# Patient Record
Sex: Female | Born: 1980 | Marital: Single | Smoking: Current every day smoker
Health system: Southern US, Community
[De-identification: ages and names within clinical notes are randomized; demographics above are authoritative.]

---

## 2014-08-14 ENCOUNTER — Emergency Department: Payer: Self-pay | Admitting: Emergency Medicine

## 2015-06-04 ENCOUNTER — Emergency Department
Admission: EM | Admit: 2015-06-04 | Discharge: 2015-06-04 | Disposition: A | Discharge status: Home / Self Care | Payer: Self-pay

## 2015-06-04 NOTE — ED Notes (Signed)
Patient ambulatory to triage with steady gait, without difficulty or distress noted, pt brought in by [facility] offcer for blood draw; pt voices good understanding and denies need to to see ED provider, denies any c/o

## 2015-06-04 NOTE — ED Notes (Signed)
Using kit provided by officers, tourniquet applied to right upper arm; antecubital area prepped with betadine and allowed to dry, needle inserted and blood collected; pt tolerated well; Labeled samples given to [employer] using chain of custody

## 2016-07-25 ENCOUNTER — Emergency Department
Admission: EM | Admit: 2016-07-25 | Discharge: 2016-07-25 | Disposition: A | Discharge status: Home / Self Care | Payer: Self-pay | Attending: Emergency Medicine | Admitting: Emergency Medicine

## 2016-07-25 ENCOUNTER — Emergency Department: Payer: Self-pay

## 2016-07-25 DIAGNOSIS — Y939 Activity, unspecified: Secondary | ICD-10-CM | POA: Insufficient documentation

## 2016-07-25 DIAGNOSIS — F17210 Nicotine dependence, cigarettes, uncomplicated: Secondary | ICD-10-CM | POA: Insufficient documentation

## 2016-07-25 DIAGNOSIS — F1910 Other psychoactive substance abuse, uncomplicated: Secondary | ICD-10-CM | POA: Insufficient documentation

## 2016-07-25 DIAGNOSIS — Y999 Unspecified external cause status: Secondary | ICD-10-CM | POA: Insufficient documentation

## 2016-07-25 DIAGNOSIS — Y929 Unspecified place or not applicable: Secondary | ICD-10-CM | POA: Insufficient documentation

## 2016-07-25 DIAGNOSIS — E860 Dehydration: Secondary | ICD-10-CM | POA: Insufficient documentation

## 2016-07-25 DIAGNOSIS — W19XXXA Unspecified fall, initial encounter: Secondary | ICD-10-CM | POA: Insufficient documentation

## 2016-07-25 DIAGNOSIS — S0990XA Unspecified injury of head, initial encounter: Secondary | ICD-10-CM | POA: Insufficient documentation

## 2016-07-25 DIAGNOSIS — S060X9A Concussion with loss of consciousness of unspecified duration, initial encounter: Secondary | ICD-10-CM | POA: Insufficient documentation

## 2016-07-25 DIAGNOSIS — Z5181 Encounter for therapeutic drug level monitoring: Secondary | ICD-10-CM | POA: Insufficient documentation

## 2016-07-25 LAB — URINE DRUG SCREEN, QUALITATIVE (ARMC ONLY)
AMPHETAMINES, UR SCREEN: NOT DETECTED
BENZODIAZEPINE, UR SCRN: NOT DETECTED
Barbiturates, Ur Screen: NOT DETECTED
CANNABINOID 50 NG, UR ~~LOC~~: POSITIVE — AB
Cocaine Metabolite,Ur ~~LOC~~: POSITIVE — AB
MDMA (Ecstasy)Ur Screen: NOT DETECTED
Methadone Scn, Ur: NOT DETECTED
OPIATE, UR SCREEN: NOT DETECTED
PHENCYCLIDINE (PCP) UR S: NOT DETECTED
Tricyclic, Ur Screen: NOT DETECTED

## 2016-07-25 LAB — COMPREHENSIVE METABOLIC PANEL
ALK PHOS: 65 U/L (ref 38–126)
ALT: 16 U/L (ref 14–54)
ANION GAP: 9 (ref 5–15)
AST: 21 U/L (ref 15–41)
Albumin: 4.2 g/dL (ref 3.5–5.0)
BUN: 10 mg/dL (ref 6–20)
CALCIUM: 8.8 mg/dL — AB (ref 8.9–10.3)
CHLORIDE: 99 mmol/L — AB (ref 101–111)
CO2: 29 mmol/L (ref 22–32)
CREATININE: 0.52 mg/dL (ref 0.44–1.00)
Glucose, Bld: 106 mg/dL — ABNORMAL HIGH (ref 65–99)
Potassium: 3.6 mmol/L (ref 3.5–5.1)
SODIUM: 137 mmol/L (ref 135–145)
Total Bilirubin: 0.6 mg/dL (ref 0.3–1.2)
Total Protein: 7.7 g/dL (ref 6.5–8.1)

## 2016-07-25 LAB — CBC WITH DIFFERENTIAL/PLATELET
BASOS ABS: 0 10*3/uL (ref 0–0.1)
BASOS PCT: 0 %
EOS PCT: 0 %
Eosinophils Absolute: 0 10*3/uL (ref 0–0.7)
HCT: 38.8 % (ref 35.0–47.0)
Hemoglobin: 13.2 g/dL (ref 12.0–16.0)
Lymphocytes Relative: 9 %
Lymphs Abs: 1.7 10*3/uL (ref 1.0–3.6)
MCH: 32.3 pg (ref 26.0–34.0)
MCHC: 34 g/dL (ref 32.0–36.0)
MCV: 95.1 fL (ref 80.0–100.0)
MONO ABS: 1.5 10*3/uL — AB (ref 0.2–0.9)
Monocytes Relative: 8 %
Neutro Abs: 14.6 10*3/uL — ABNORMAL HIGH (ref 1.4–6.5)
Neutrophils Relative %: 83 %
PLATELETS: 232 10*3/uL (ref 150–440)
RBC: 4.09 MIL/uL (ref 3.80–5.20)
RDW: 14.8 % — AB (ref 11.5–14.5)
WBC: 17.8 10*3/uL — AB (ref 3.6–11.0)

## 2016-07-25 LAB — URINALYSIS COMPLETE WITH MICROSCOPIC (ARMC ONLY)
BILIRUBIN URINE: NEGATIVE
Glucose, UA: NEGATIVE mg/dL
Hgb urine dipstick: NEGATIVE
LEUKOCYTES UA: NEGATIVE
NITRITE: NEGATIVE
PH: 7 (ref 5.0–8.0)
PROTEIN: NEGATIVE mg/dL
SPECIFIC GRAVITY, URINE: 1.016 (ref 1.005–1.030)

## 2016-07-25 LAB — ETHANOL

## 2016-07-25 LAB — POCT PREGNANCY, URINE: PREG TEST UR: NEGATIVE

## 2016-07-25 LAB — LIPASE, BLOOD: LIPASE: 23 U/L (ref 11–51)

## 2016-07-25 MED ORDER — KETOROLAC TROMETHAMINE 30 MG/ML IJ SOLN
15.0000 mg | INTRAMUSCULAR | Status: AC
  Administered 2016-07-25: 15 mg via INTRAVENOUS
  Filled 2016-07-25: qty 1

## 2016-07-25 MED ORDER — SODIUM CHLORIDE 0.9 % IV BOLUS (SEPSIS)
1000.0000 mL | Freq: Once | INTRAVENOUS | Status: AC
  Administered 2016-07-25: 1000 mL via INTRAVENOUS

## 2016-07-25 NOTE — ED Triage Notes (Signed)
Pt came to ED via EMS. Pt reports partying on Thursday night, drank alcohol and smoked weed and cocaine. Pt remembers falling but does not remember anything else. Has 2 hematomas to back of head, bruising to right side of ear.

## 2016-07-25 NOTE — ED Provider Notes (Signed)
[facility] Emergency Department Provider Note  ____________________________________________  Time seen: Approximately 11:13 AM  I have reviewed the triage vital signs and the nursing notes.   HISTORY  Chief Complaint Fall and Head Injury    HPI Terri Gregory is a 35 y.o. female who complains of diffuse posterior headache and neck pain over the past 24 hours. Gradual onset. She relates this all to have a substance abuse is been going on for the last 36 hours including frequent alcohol use, marijuana and cocaine use. She reports that she is a daily drinker, doesn't drink in the mornings, denies any history of withdrawal symptoms such as shaking or anxiousness or seizures.  Denies any other injuries. No belly pain and vomiting or diarrhea. No chest pain or shortness of breath.     History reviewed. No pertinent past medical history. Polysubstance abuse  There are no active problems to display for this patient.    History reviewed. No pertinent surgical history.   Prior to Admission medications   Not on File     Allergies Tetracyclines & related   No family history on file.  Social History Social History  Substance Use Topics  . Smoking status: Current Every Day Smoker    Packs/day: 1.00    Types: Cigarettes  . Smokeless tobacco: Never Used  . Alcohol use Yes    Review of Systems  Constitutional:   No fever or chills.  ENT:   Positive right ear pain. Cardiovascular:   No chest pain. Respiratory:   No dyspnea or cough. Gastrointestinal:   Negative for abdominal pain, vomiting and diarrhea.  Musculoskeletal:   Negative for focal pain or swelling Neurological:   Positive posterior headache and neck pain. No weakness or paresthesia 10-point ROS otherwise negative.  ____________________________________________   PHYSICAL EXAM:  VITAL SIGNS: ED Triage Vitals  Enc Vitals Group     BP 07/25/16 1050 (!) 141/103     Pulse Rate  07/25/16 1050 (!) 104     Resp 07/25/16 1050 16     Temp 07/25/16 1050 99.1 F (37.3 C)     Temp Source 07/25/16 1050 Oral     SpO2 07/25/16 1050 97 %     Weight 07/25/16 1051 130 lb (59 kg)     Height 07/25/16 1051 5\' 2"  (1.575 m)     Head Circumference --      Peak Flow --      Pain Score --      Pain Loc --      Pain Edu? --      Excl. in GC? --     Vital signs reviewed, nursing assessments reviewed.   Constitutional:   Alert and oriented. Well appearing and in no distress. Eyes:   No scleral icterus. No conjunctival pallor. PERRL. EOMI.  No nystagmus. ENT   Head:   Normocephalic With slight occipital scalp swelling. No lacerations or contusions. Diffusely tender over the posterior scalp. There is ecchymosis posterior to the left auricle concerning for Battle sign..   Nose:   No congestion/rhinnorhea. No septal hematoma   Mouth/Throat:   MMM, no pharyngeal erythema. No peritonsillar mass.    Neck:   No stridor. No SubQ emphysema. No meningismus. Diffuse tenderness of the neck. Hematological/Lymphatic/Immunilogical:   No cervical lymphadenopathy. Cardiovascular:   Tachycardia heart rate 105. Symmetric bilateral radial and DP pulses.  No murmurs.  Respiratory:   Normal respiratory effort without tachypnea nor retractions. Breath sounds are clear and equal bilaterally.  No wheezes/rales/rhonchi. Gastrointestinal:   Soft and nontender. Non distended. There is no CVA tenderness.  No rebound, rigidity, or guarding. Genitourinary:   deferred Musculoskeletal:   Nontender with normal range of motion in all extremities. No joint effusions.  No lower extremity tenderness.  No edema. Neurologic:   Normal speech and language.  CN 2-10 normal. Motor grossly intact. No gross focal neurologic deficits are appreciated.  Skin:    Skin is warm, dry and intact. No rash noted.  No petechiae, purpura, or bullae.  ____________________________________________    LABS (pertinent  positives/negatives) (all labs ordered are listed, but only abnormal results are displayed) Labs Reviewed  URINALYSIS COMPLETEWITH MICROSCOPIC ([facility] ONLY) - Abnormal; Notable for the following:       Result Value   Color, Urine YELLOW (*)    APPearance HAZY (*)    Ketones, ur 1+ (*)    Bacteria, UA RARE (*)    Squamous Epithelial / LPF 0-5 (*)    All other components within normal limits  URINE DRUG SCREEN, QUALITATIVE ([facility] ONLY) - Abnormal; Notable for the following:    Cocaine Metabolite,Ur [place] POSITIVE (*)    Cannabinoid 50 Ng, Ur [place] POSITIVE (*)    All other components within normal limits  COMPREHENSIVE METABOLIC PANEL - Abnormal; Notable for the following:    Chloride 99 (*)    Glucose, Bld 106 (*)    Calcium 8.8 (*)    All other components within normal limits  CBC WITH DIFFERENTIAL/PLATELET - Abnormal; Notable for the following:    WBC 17.8 (*)    RDW 14.8 (*)    Neutro Abs 14.6 (*)    Monocytes Absolute 1.5 (*)    All other components within normal limits  ETHANOL  LIPASE, BLOOD  POC URINE PREG, ED  POCT PREGNANCY, URINE   ____________________________________________   EKG  Interpreted by me Sinus tachycardia rate 101, normal axis intervals QRS ST segments and T waves.  ____________________________________________    RADIOLOGY  CT head unremarkable CT cervical spine unremarkable  ____________________________________________   PROCEDURES Procedures  ____________________________________________   INITIAL IMPRESSION / ASSESSMENT AND PLAN / ED COURSE  Pertinent labs & imaging results that were available during my care of the patient were reviewed by me and considered in my medical decision making (see chart for details).  Patient presents with headache and neck pain after recent polysubstance abuse, has poor memory and reports blacking out but multiple falls. Concern for possible skull fracture or neck injury although she has been ambulatory and has  no neurologic deficits. We will get CT head and cervical spine, check labs and pregnancy status. IV fluids for tachycardia. Does not appear to be in any significant withdrawal syndrome at this time, hemodynamically stable.   ----------------------------------------- 1:37 PM on 07/25/2016 -----------------------------------------  Workup negative. Patient moving about with good strength, ambulatory, tolerating oral intake. Clinically sober at this time. Vitals unremarkable, tachycardia improved after fluids. We'll discharge to follow up with primary care and Rha. I think the leukocytosis is likely due to her stimulant use and not due to any underlying infection. Specific there is no evidence of urinary tract infection pneumonia meningitis or encephalitis or any soft tissue infection related to her recent trauma. Does not have any wounds that warrant updating of her tetanus.    Clinical Course   ____________________________________________   FINAL CLINICAL IMPRESSION(S) / ED DIAGNOSES  Final diagnoses:  Injury of head, initial encounter  Concussion with loss of consciousness, initial encounter  Polysubstance  abuse  Dehydration       Portions of this note were generated with dragon dictation software. Dictation errors may occur despite best attempts at proofreading.    Sharman Cheek, MD 07/25/16 1340

## 2018-05-19 IMAGING — CT CT CERVICAL SPINE W/O CM
4 of 8 series · 13 of 33 positions shown, 14 images · non-contrast
Comparison: None.

CLINICAL DATA: Fall with hematoma at to back of head and
right-sided ear bruising. Initial encounter.

EXAM:
CT HEAD WITHOUT CONTRAST
CT CERVICAL SPINE WITHOUT CONTRAST
TECHNIQUE: Multidetector CT imaging of the head and cervical spine was
performed following the standard protocol without intravenous
contrast. Multiplanar CT image reconstructions of the cervical spine
were also generated.

[Series 8: c spine soft · axial · 0.28mm/px · z∈[-191,-117]mm · 3 of 75 slices shown]
[im 19/75  soft-tissue]
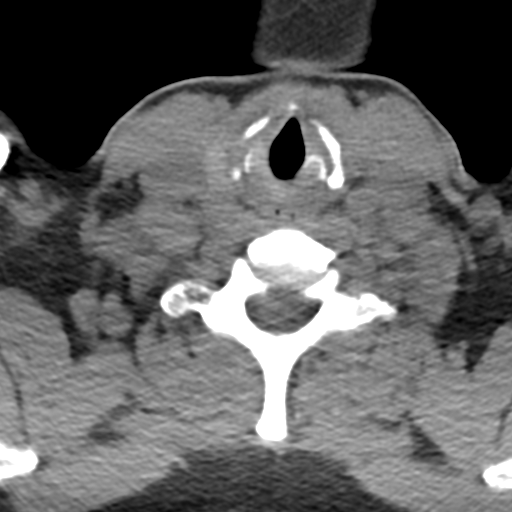
[im 38/75  soft-tissue]
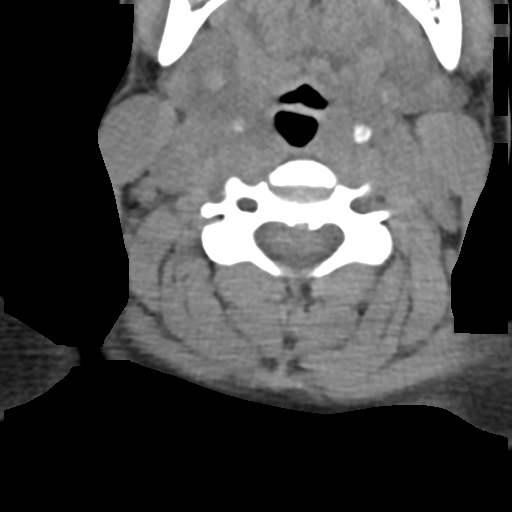
[im 56/75  soft-tissue]
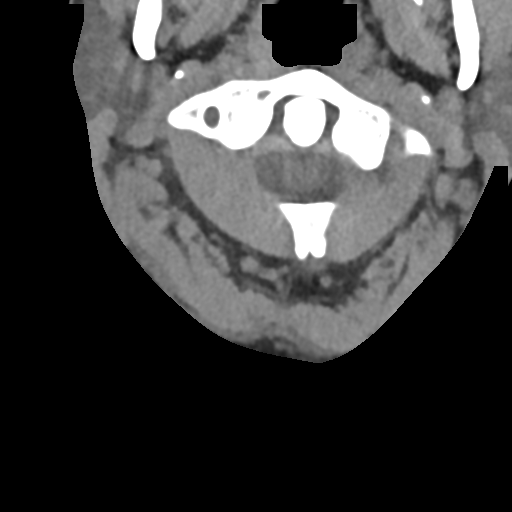

[Series 11: sagittal bone · sagittal · 0.25mm/px · 5 of 59 slices shown]
[im 10/59  bone]
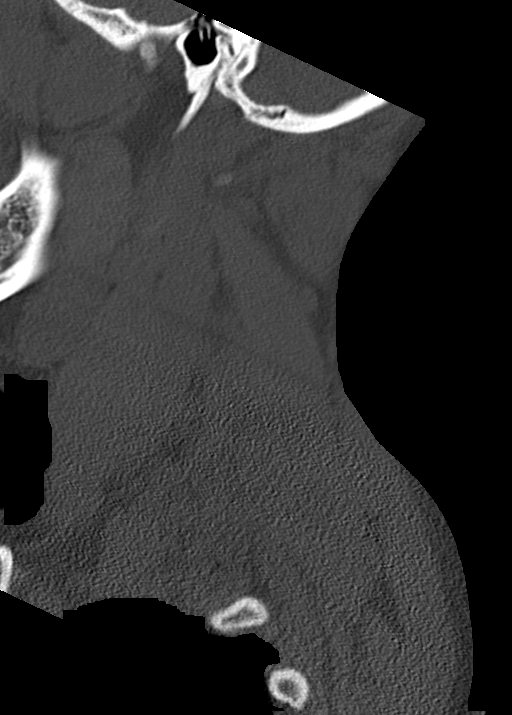
[im 20/59  bone]
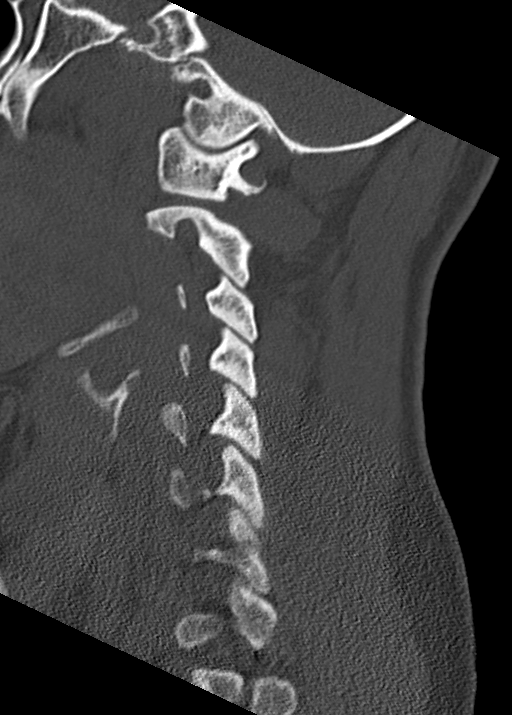
[im 30/59  bone]
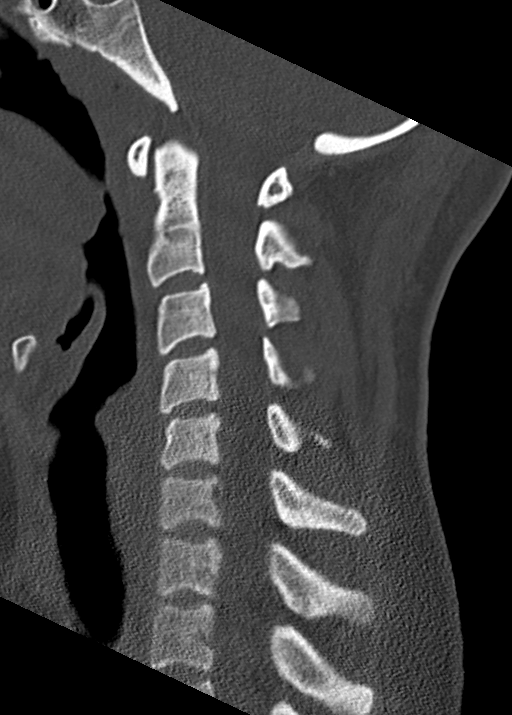
[im 39/59  bone]
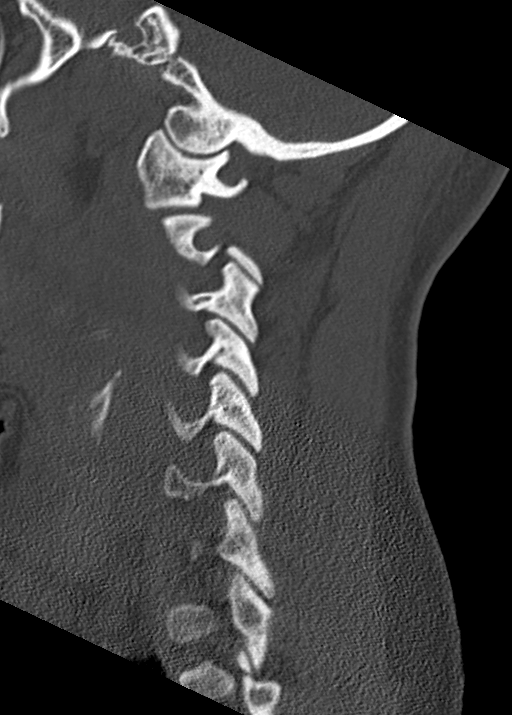
[im 49/59  bone]
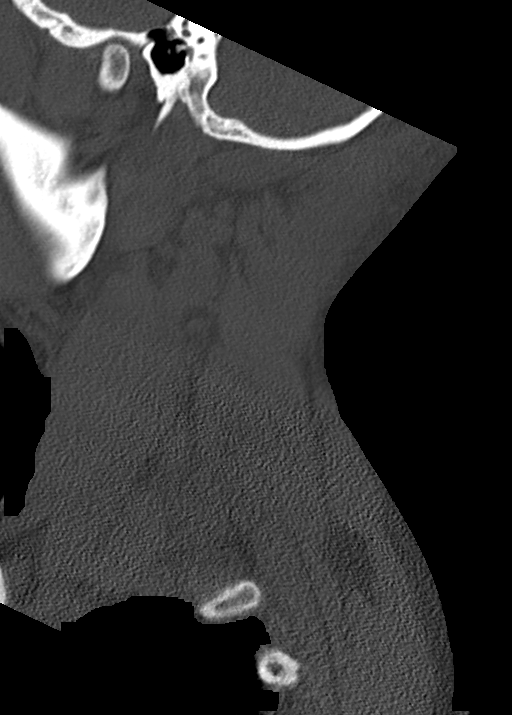

[Series 12: coronal bone · coronal · 0.26mm/px · 1 of 54 slices shown]
[im 27/54  bone]
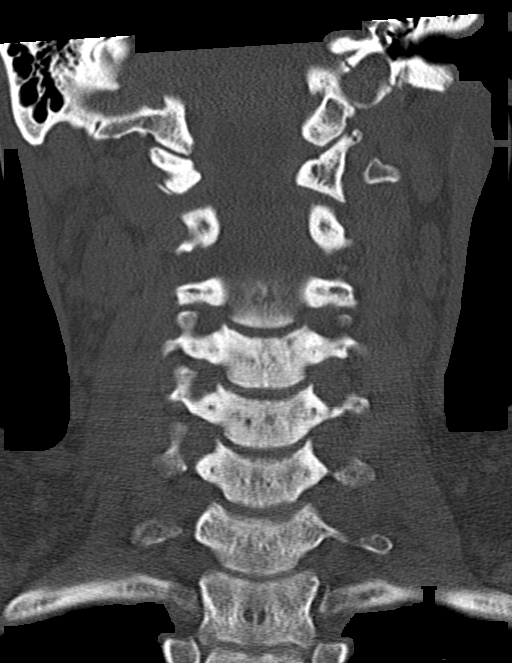

[Series 13: orthogonal bone · axial · 0.25mm/px · z∈[-224,-139]mm · 4 of 79 slices shown, 5 images]
[im 16/79  soft-tissue]
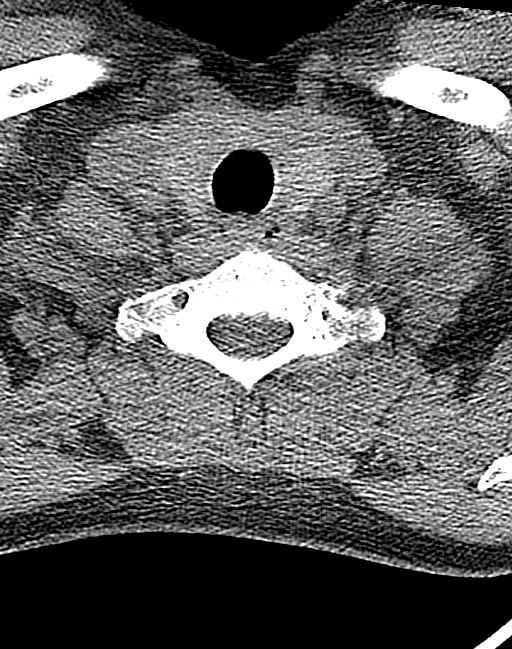
[im 16/79  bone]
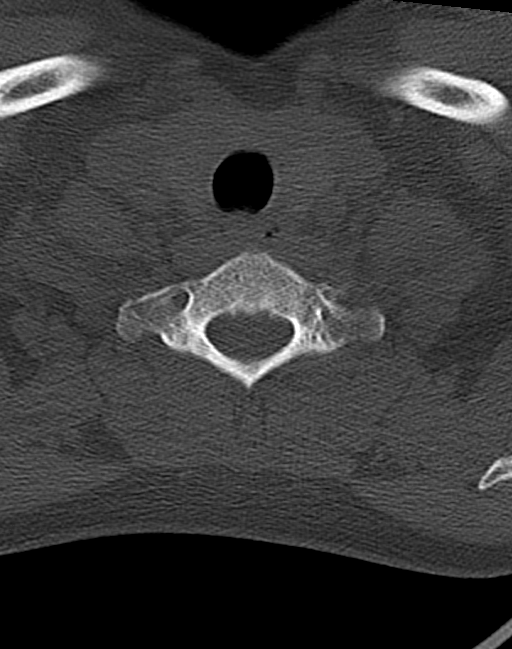
[im 32/79  bone]
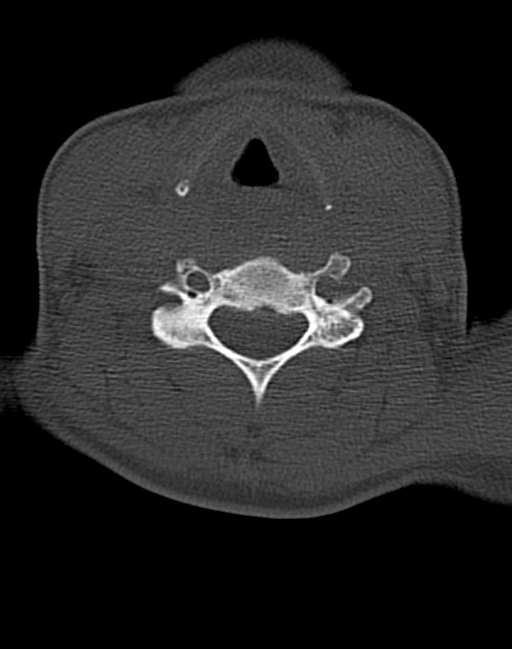
[im 47/79  bone]
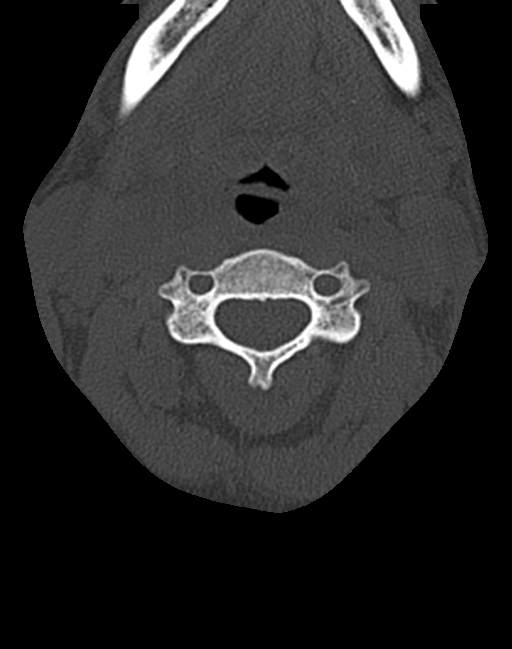
[im 63/79  bone]
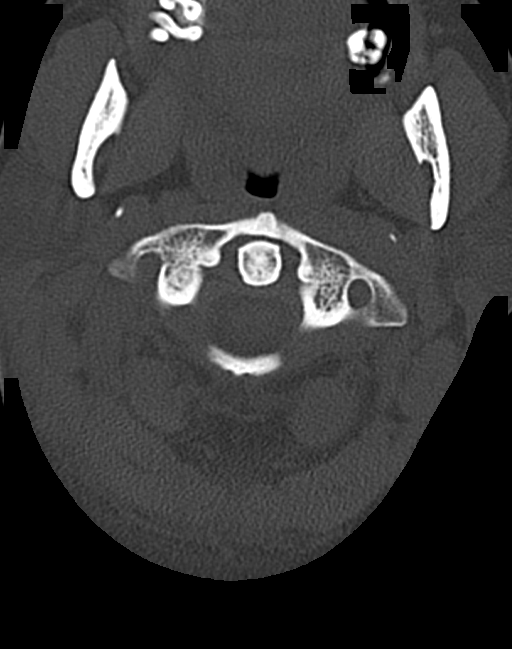

[13 of 33 positions shown; findings below may reference images not displayed]

FINDINGS: CT HEAD FINDINGS

Brain: No evidence of acute infarction, hemorrhage, hydrocephalus,
extra-axial collection or mass lesion/mass effect.

Vascular: No hyperdense vessel or unexpected calcification.

Skull: Normal. Negative for fracture or focal lesion.

Sinuses/Orbits: No acute finding.

CT CERVICAL SPINE FINDINGS

Alignment: Normal.

Skull base and vertebrae: No acute fracture. No primary bone lesion
or focal pathologic process.

Soft tissues and spinal canal: No prevertebral fluid or swelling. No
visible canal hematoma.

Disc levels: Normal cervical disc heights. No evidence of
spondylosis.

Upper chest: Negative.
IMPRESSION: Normal CT studies of the head and cervical spine.
